# Patient Record
Sex: Male | Born: 1985 | Race: Black or African American | Hispanic: No | Marital: Married
Health system: Southern US, Community
[De-identification: ages and names within clinical notes are randomized; demographics above are authoritative.]

---

## 2006-04-06 ENCOUNTER — Emergency Department (HOSPITAL_COMMUNITY): Admission: EM | Admit: 2006-04-06 | Discharge: 2006-04-06 | Payer: Self-pay | Admitting: Emergency Medicine

## 2010-07-14 ENCOUNTER — Emergency Department (HOSPITAL_COMMUNITY)
Admission: EM | Admit: 2010-07-14 | Discharge: 2010-07-14 | Payer: Self-pay | Source: Home / Self Care | Admitting: Emergency Medicine

## 2014-07-24 ENCOUNTER — Ambulatory Visit
Admission: RE | Admit: 2014-07-24 | Discharge: 2014-07-24 | Disposition: A | Discharge status: Home / Self Care | Payer: BC Managed Care – PPO | Source: Ambulatory Visit | Attending: Internal Medicine | Admitting: Internal Medicine

## 2014-07-24 ENCOUNTER — Other Ambulatory Visit: Payer: Self-pay | Admitting: Internal Medicine

## 2014-07-24 DIAGNOSIS — J189 Pneumonia, unspecified organism: Secondary | ICD-10-CM

## 2016-03-15 IMAGING — CR DG CHEST 2V
2 series · 2 of 2 positions shown · non-contrast
Comparison: None.

CLINICAL DATA: Pneumonia.

EXAM:
CHEST  2 VIEW

[w chest pa]
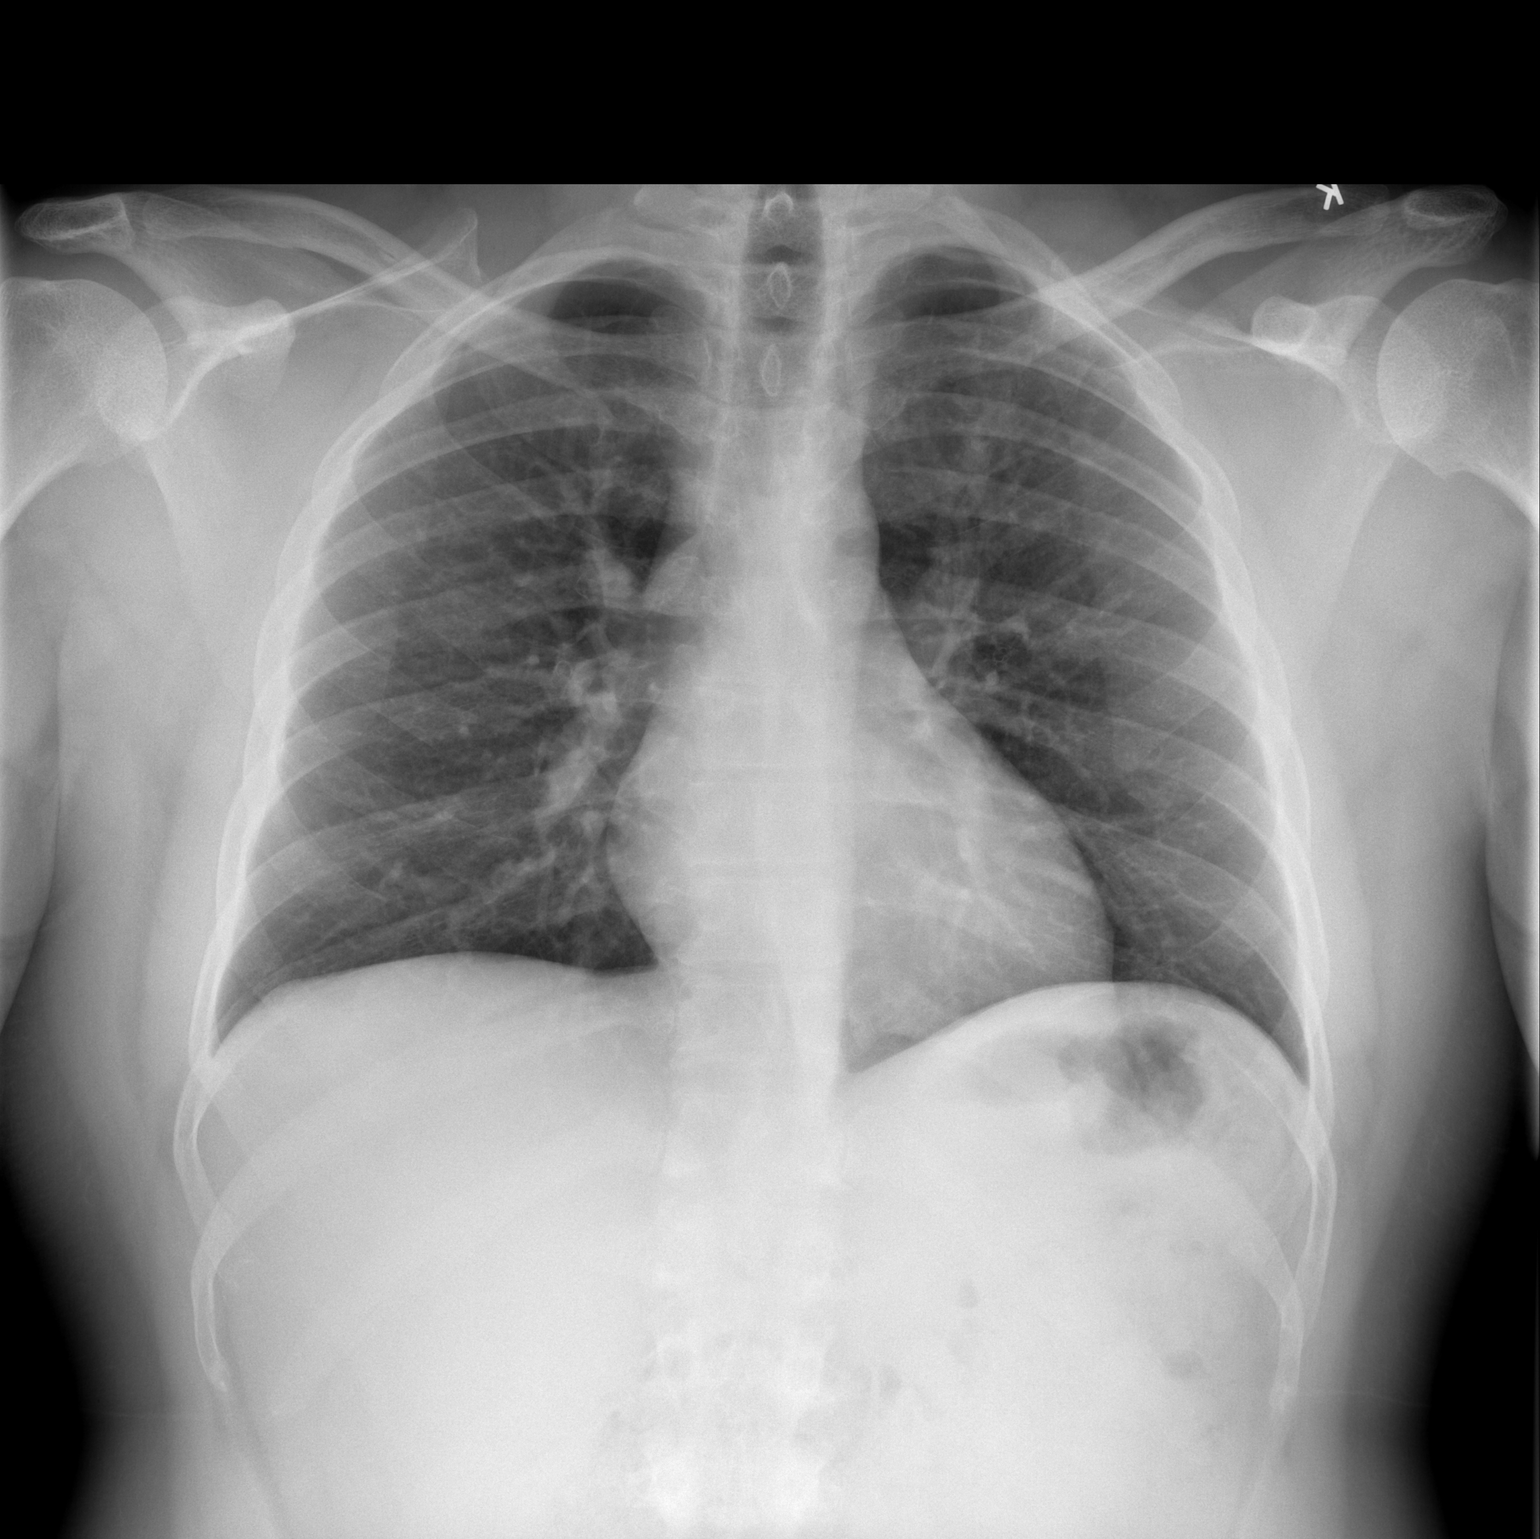

[w chest lat]
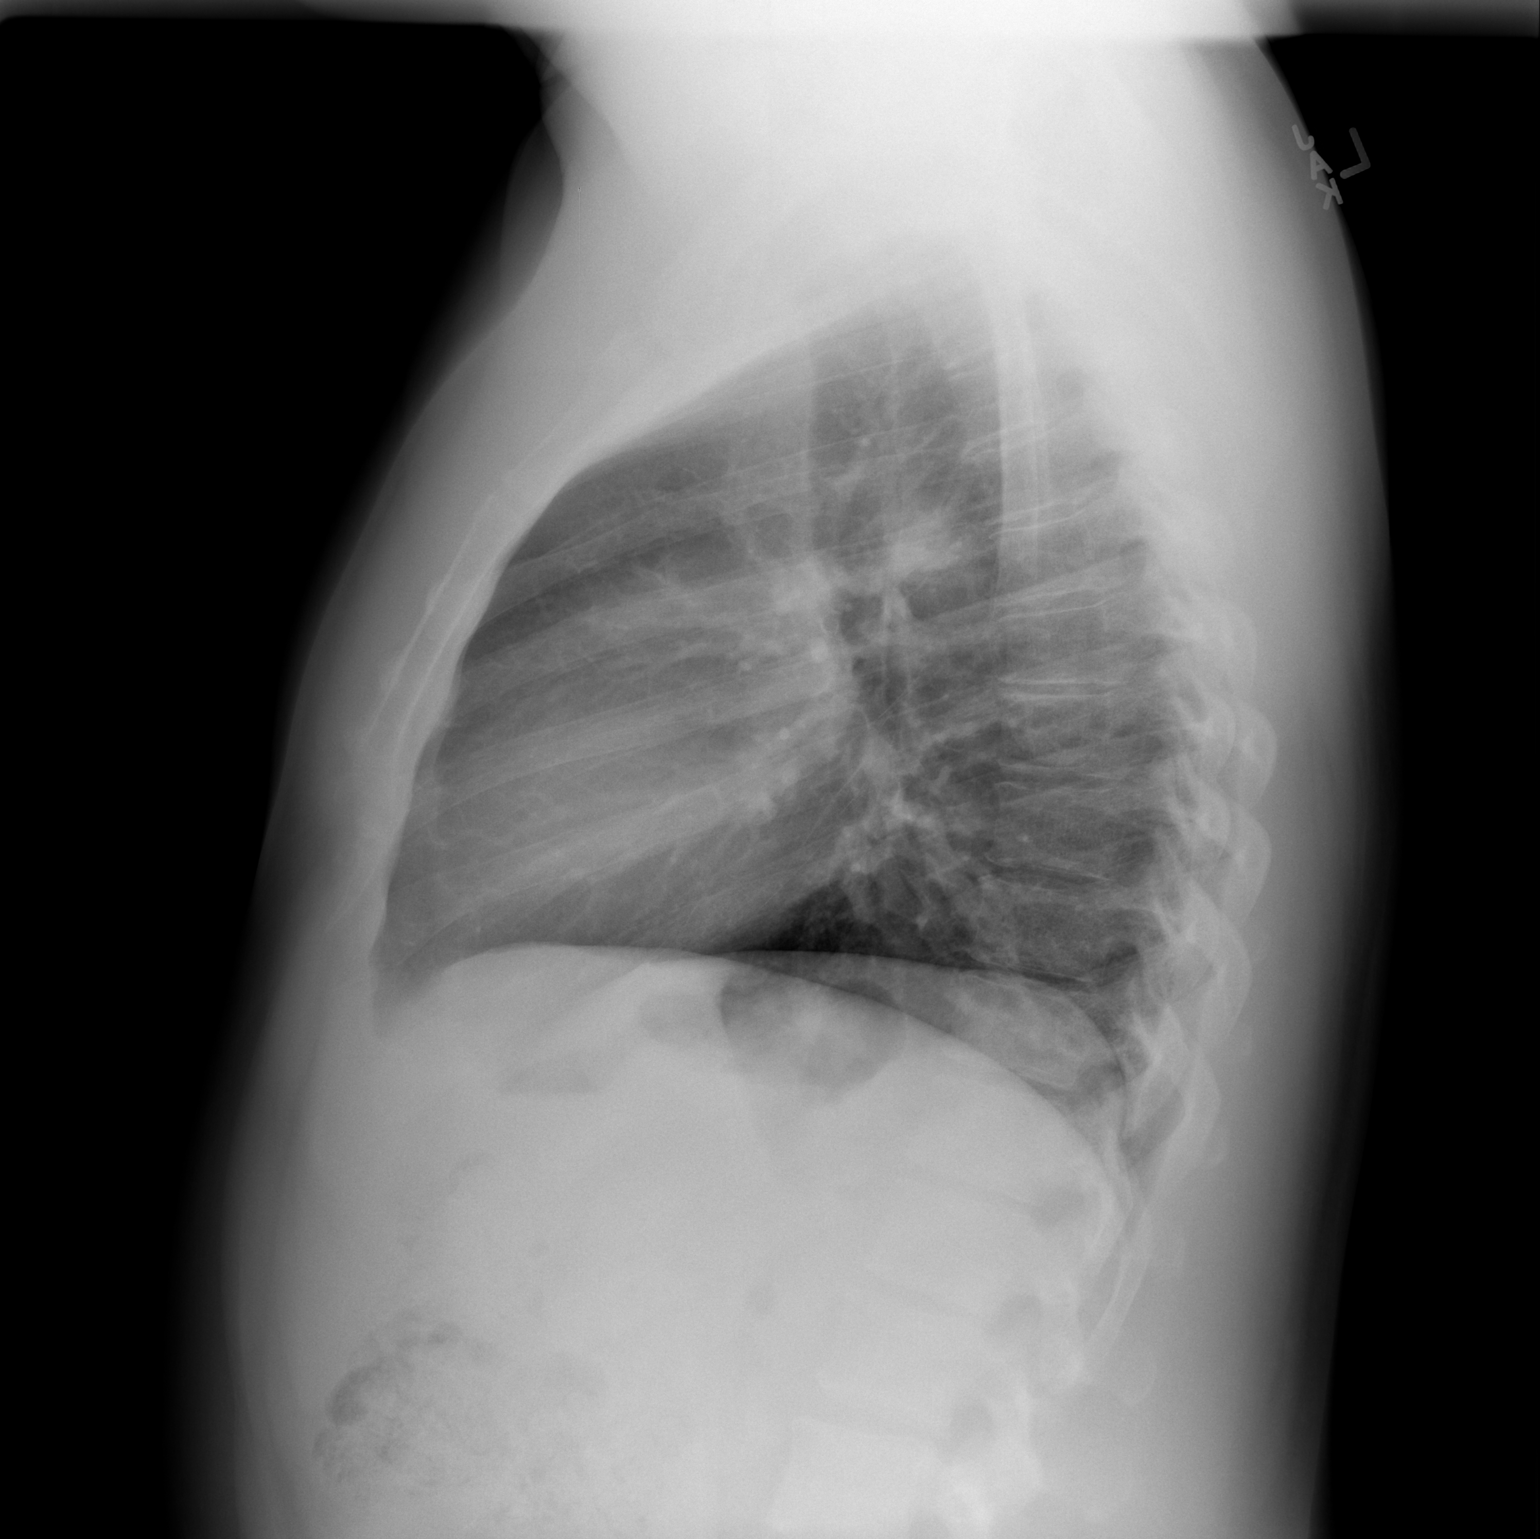

[2 of 2 positions shown; findings below may reference images not displayed]

FINDINGS: Mediastinum and hilar structures are normal. Lungs are clear. No
pleural effusion or pneumothorax. Heart size normal. Interim
clearing of right mid lung and left base pulmonary infiltrates. No
acute bony abnormality.
IMPRESSION: Interim clearing of bilateral pulmonary infiltrates.

## 2020-05-06 ENCOUNTER — Ambulatory Visit: Payer: BC Managed Care – PPO | Attending: Family

## 2020-05-06 DIAGNOSIS — Z23 Encounter for immunization: Secondary | ICD-10-CM

## 2020-06-05 ENCOUNTER — Ambulatory Visit: Payer: BC Managed Care – PPO | Attending: Family

## 2020-06-05 DIAGNOSIS — Z23 Encounter for immunization: Secondary | ICD-10-CM

## 2020-07-19 NOTE — Progress Notes (Signed)
   Covid-19 Vaccination Clinic  Name:  Wesley Nixon    MRN: [id number] DOB: 1986/02/18  07/19/2020  Wesley Nixon was observed post Covid-19 immunization for 15 minutes without incident. He was provided with Vaccine Information Sheet and instruction to access the V-Safe system.   Wesley Nixon was instructed to call 911 with any severe reactions post vaccine: Marland Kitchen Difficulty breathing  . Swelling of face and throat  . A fast heartbeat  . A bad rash all over body  . Dizziness and weakness   Immunizations Administered    Name Date Dose VIS Date Route   Pfizer COVID-19 Vaccine 05/06/2020  4:00 PM 0.3 mL 04/22/2020 Intramuscular   Manufacturer: [employer], [employer]   Lot: [id number]   NDC: [address]-5

## 2020-09-30 NOTE — Progress Notes (Signed)
   Covid-19 Vaccination Clinic  Name:  Wesley Nixon    MRN: [id number] DOB: April 03, 1986  09/30/2020  [name] was observed post Covid-19 immunization for 15 minutes without incident. He was provided with Vaccine Information Sheet and instruction to access the V-Safe system.   [name] was instructed to call 911 with any severe reactions post vaccine: Marland Kitchen Difficulty breathing  . Swelling of face and throat  . A fast heartbeat  . A bad rash all over body  . Dizziness and weakness   Immunizations Administered    Name Date Dose VIS Date Route   Pfizer COVID-19 Vaccine 06/05/2020  9:30 AM 0.3 mL 04/22/2020 Intramuscular   Manufacturer: [employer], [employer]   Lot: [id number]   NDC: [address]-2

## 2020-10-01 ENCOUNTER — Emergency Department (HOSPITAL_COMMUNITY): Payer: BC Managed Care – PPO

## 2020-10-01 ENCOUNTER — Other Ambulatory Visit: Payer: Self-pay

## 2020-10-01 ENCOUNTER — Ambulatory Visit (HOSPITAL_COMMUNITY)
Admission: EM | Admit: 2020-10-01 | Discharge: 2020-10-02 | Disposition: A | Discharge status: Home / Self Care | Payer: BC Managed Care – PPO | Attending: Emergency Medicine | Admitting: Emergency Medicine

## 2020-10-01 ENCOUNTER — Encounter (HOSPITAL_COMMUNITY): Payer: Self-pay | Admitting: Emergency Medicine

## 2020-10-01 ENCOUNTER — Encounter (HOSPITAL_COMMUNITY)
Admission: EM | Disposition: A | Discharge status: Home / Self Care | Payer: Self-pay | Source: Home / Self Care | Attending: Emergency Medicine

## 2020-10-01 DIAGNOSIS — Z23 Encounter for immunization: Secondary | ICD-10-CM | POA: Diagnosis not present

## 2020-10-01 DIAGNOSIS — Y9389 Activity, other specified: Secondary | ICD-10-CM | POA: Insufficient documentation

## 2020-10-01 DIAGNOSIS — Y9289 Other specified places as the place of occurrence of the external cause: Secondary | ICD-10-CM | POA: Insufficient documentation

## 2020-10-01 DIAGNOSIS — S62636B Displaced fracture of distal phalanx of right little finger, initial encounter for open fracture: Secondary | ICD-10-CM | POA: Diagnosis not present

## 2020-10-01 DIAGNOSIS — Z20822 Contact with and (suspected) exposure to covid-19: Secondary | ICD-10-CM | POA: Insufficient documentation

## 2020-10-01 DIAGNOSIS — X58XXXA Exposure to other specified factors, initial encounter: Secondary | ICD-10-CM | POA: Insufficient documentation

## 2020-10-01 DIAGNOSIS — Z91013 Allergy to seafood: Secondary | ICD-10-CM | POA: Insufficient documentation

## 2020-10-01 DIAGNOSIS — S61316A Laceration without foreign body of right little finger with damage to nail, initial encounter: Secondary | ICD-10-CM | POA: Diagnosis present

## 2020-10-01 HISTORY — PX: I & D EXTREMITY: SHX5045

## 2020-10-01 LAB — BASIC METABOLIC PANEL
Anion gap: 8 (ref 5–15)
BUN: 9 mg/dL (ref 6–20)
CO2: 26 mmol/L (ref 22–32)
Calcium: 9.6 mg/dL (ref 8.9–10.3)
Chloride: 101 mmol/L (ref 98–111)
Creatinine, Ser: 0.79 mg/dL (ref 0.61–1.24)
GFR, Estimated: >60 mL/min (ref >60)
Glucose, Bld: 279 mg/dL — ABNORMAL HIGH (ref 70–99)
Potassium: 4.1 mmol/L (ref 3.5–5.1)
Sodium: 135 mmol/L (ref 135–145)

## 2020-10-01 LAB — CBC WITH DIFFERENTIAL/PLATELET
Abs Immature Granulocytes: 0.02 10*3/uL (ref 0.00–0.07)
Basophils Absolute: 0 10*3/uL (ref 0.0–0.1)
Basophils Relative: 0 %
Eosinophils Absolute: 0 10*3/uL (ref 0.0–0.5)
Eosinophils Relative: 0 %
HCT: 49.7 % (ref 39.0–52.0)
Hemoglobin: 16.4 g/dL (ref 13.0–17.0)
Immature Granulocytes: 0 %
Lymphocytes Relative: 28 %
Lymphs Abs: 2.1 10*3/uL (ref 0.7–4.0)
MCH: 28.7 pg (ref 26.0–34.0)
MCHC: 33 g/dL (ref 30.0–36.0)
MCV: 87 fL (ref 80.0–100.0)
Monocytes Absolute: 0.5 10*3/uL (ref 0.1–1.0)
Monocytes Relative: 6 %
Neutro Abs: 5 10*3/uL (ref 1.7–7.7)
Neutrophils Relative %: 66 %
Platelets: 238 10*3/uL (ref 150–400)
RBC: 5.71 MIL/uL (ref 4.22–5.81)
RDW: 12.4 % (ref 11.5–15.5)
WBC: 7.6 10*3/uL (ref 4.0–10.5)
nRBC: 0 % (ref 0.0–0.2)

## 2020-10-01 LAB — RESP PANEL BY RT-PCR (FLU A&B, COVID) ARPGX2
Influenza A by PCR: NEGATIVE
Influenza B by PCR: NEGATIVE
SARS Coronavirus 2 by RT PCR: NEGATIVE

## 2020-10-01 LAB — CBG MONITORING, ED: Glucose-Capillary: 275 mg/dL — ABNORMAL HIGH (ref 70–99)

## 2020-10-01 SURGERY — IRRIGATION AND DEBRIDEMENT EXTREMITY
Anesthesia: General | Site: Finger | Laterality: Right

## 2020-10-01 MED ORDER — TETANUS-DIPHTH-ACELL PERTUSSIS 5-2.5-18.5 LF-MCG/0.5 IM SUSY
0.5000 mL | PREFILLED_SYRINGE | Freq: Once | INTRAMUSCULAR | Status: AC
  Administered 2020-10-01: 0.5 mL via INTRAMUSCULAR
  Filled 2020-10-01: qty 0.5

## 2020-10-01 MED ORDER — MIDAZOLAM HCL 2 MG/2ML IJ SOLN
INTRAMUSCULAR | Status: AC
  Filled 2020-10-01: qty 2

## 2020-10-01 MED ORDER — HYDROMORPHONE HCL 1 MG/ML IJ SOLN
1.0000 mg | Freq: Once | INTRAMUSCULAR | Status: AC
  Administered 2020-10-01: 1 mg via INTRAVENOUS
  Filled 2020-10-01: qty 1

## 2020-10-01 MED ORDER — FENTANYL CITRATE (PF) 250 MCG/5ML IJ SOLN
INTRAMUSCULAR | Status: AC
  Filled 2020-10-01: qty 5

## 2020-10-01 MED ORDER — CEFAZOLIN SODIUM-DEXTROSE 1-4 GM/50ML-% IV SOLN: 1.0000 g | Freq: Once | INTRAVENOUS | Status: DC

## 2020-10-01 MED ORDER — CEFAZOLIN SODIUM-DEXTROSE 2-4 GM/100ML-% IV SOLN
2.0000 g | Freq: Once | INTRAVENOUS | Status: AC
  Administered 2020-10-01: 2 g via INTRAVENOUS
  Filled 2020-10-01: qty 100

## 2020-10-01 SURGICAL SUPPLY — 61 items
ADAPTER CATH SYR TO TUBING 38M (ADAPTER) IMPLANT
BNDG COHESIVE 2X5 TAN STRL LF (GAUZE/BANDAGES/DRESSINGS) IMPLANT
BNDG ELASTIC 3X5.8 VLCR STR LF (GAUZE/BANDAGES/DRESSINGS) ×2 IMPLANT
BNDG ELASTIC 4X5.8 VLCR STR LF (GAUZE/BANDAGES/DRESSINGS) ×2 IMPLANT
BNDG ESMARK 4X9 LF (GAUZE/BANDAGES/DRESSINGS) ×2 IMPLANT
BNDG GAUZE ELAST 4 BULKY (GAUZE/BANDAGES/DRESSINGS) ×2 IMPLANT
CANNULA VESSEL 3MM 2 BLNT TIP (CANNULA) IMPLANT
CORD BIPOLAR FORCEPS 12FT (ELECTRODE) ×2 IMPLANT
COVER SURGICAL LIGHT HANDLE (MISCELLANEOUS) ×2 IMPLANT
COVER WAND RF STERILE (DRAPES) ×2 IMPLANT
CUFF TOURN SGL QUICK 18X4 (TOURNIQUET CUFF) ×2 IMPLANT
CUFF TOURN SGL QUICK 24 (TOURNIQUET CUFF)
CUFF TRNQT CYL 24X4X16.5-23 (TOURNIQUET CUFF) IMPLANT
DECANTER SPIKE VIAL GLASS SM (MISCELLANEOUS) ×2 IMPLANT
DRAIN PENROSE 1/4X12 LTX STRL (WOUND CARE) IMPLANT
DRSG PAD ABDOMINAL 8X10 ST (GAUZE/BANDAGES/DRESSINGS) IMPLANT
GAUZE SPONGE 4X4 12PLY STRL (GAUZE/BANDAGES/DRESSINGS) ×2 IMPLANT
GAUZE XEROFORM 1X8 LF (GAUZE/BANDAGES/DRESSINGS) ×2 IMPLANT
GLOVE BIO SURGEON STRL SZ7.5 (GLOVE) ×2 IMPLANT
GLOVE BIOGEL PI IND STRL 8.5 (GLOVE) IMPLANT
GLOVE BIOGEL PI INDICATOR 8.5 (GLOVE)
GLOVE BIOGEL PI ORTHO PRO SZ8 (GLOVE)
GLOVE PI ORTHO PRO STRL SZ8 (GLOVE) IMPLANT
GLOVE SRG 8 PF TXTR STRL LF DI (GLOVE) ×1 IMPLANT
GLOVE SURG ORTHO 8.0 STRL STRW (GLOVE) IMPLANT
GLOVE SURG UNDER POLY LF SZ8 (GLOVE) ×2
GOWN STRL REUS W/ TWL LRG LVL3 (GOWN DISPOSABLE) ×1 IMPLANT
GOWN STRL REUS W/ TWL XL LVL3 (GOWN DISPOSABLE) ×1 IMPLANT
GOWN STRL REUS W/TWL LRG LVL3 (GOWN DISPOSABLE) ×2
GOWN STRL REUS W/TWL XL LVL3 (GOWN DISPOSABLE) ×2
K-WIRE 4.0X.028 (WIRE) ×2 IMPLANT
KIT BASIN OR (CUSTOM PROCEDURE TRAY) ×2 IMPLANT
KIT TURNOVER KIT B (KITS) ×2 IMPLANT
LOOP VESSEL MAXI BLUE (MISCELLANEOUS) IMPLANT
MANIFOLD NEPTUNE II (INSTRUMENTS) IMPLANT
NEEDLE HYPO 25X1 1.5 SAFETY (NEEDLE) IMPLANT
NS IRRIG 1000ML POUR BTL (IV SOLUTION) ×2 IMPLANT
PACK ORTHO EXTREMITY (CUSTOM PROCEDURE TRAY) ×2 IMPLANT
PAD ARMBOARD 7.5X6 YLW CONV (MISCELLANEOUS) ×4 IMPLANT
PAD CAST 4YDX4 CTTN HI CHSV (CAST SUPPLIES) ×1 IMPLANT
PADDING CAST COTTON 4X4 STRL (CAST SUPPLIES) ×2
SET CYSTO W/LG BORE CLAMP LF (SET/KITS/TRAYS/PACK) ×2 IMPLANT
SLING ARM FOAM STRAP LRG (SOFTGOODS) ×2 IMPLANT
SOL PREP POV-IOD 4OZ 10% (MISCELLANEOUS) IMPLANT
SPLINT PLASTER EXTRA FAST 3X15 (CAST SUPPLIES) ×1
SPLINT PLASTER GYPS XFAST 3X15 (CAST SUPPLIES) ×1 IMPLANT
SPONGE LAP 4X18 RFD (DISPOSABLE) IMPLANT
SUT CHROMIC 6 0 PS 4 (SUTURE) ×2 IMPLANT
SUT ETHILON 4 0 P 3 18 (SUTURE) IMPLANT
SUT ETHILON 4 0 PS 2 18 (SUTURE) IMPLANT
SUT MON AB 5-0 P3 18 (SUTURE) IMPLANT
SUT MON AB 5-0 PS2 18 (SUTURE) ×2 IMPLANT
SWAB COLLECTION DEVICE MRSA (MISCELLANEOUS) IMPLANT
SWAB CULTURE ESWAB REG 1ML (MISCELLANEOUS) IMPLANT
SYR 20ML LL LF (SYRINGE) IMPLANT
SYR CONTROL 10ML LL (SYRINGE) ×2 IMPLANT
TOWEL GREEN STERILE (TOWEL DISPOSABLE) ×2 IMPLANT
TUBE CONNECTING 12X1/4 (SUCTIONS) ×2 IMPLANT
TUBE FEEDING ENTERAL 5FR 16IN (TUBING) IMPLANT
UNDERPAD 30X36 HEAVY ABSORB (UNDERPADS AND DIAPERS) ×4 IMPLANT
YANKAUER SUCT BULB TIP NO VENT (SUCTIONS) ×2 IMPLANT

## 2020-10-01 NOTE — Anesthesia Preprocedure Evaluation (Addendum)
Anesthesia Evaluation  Patient identified by MRN, date of birth, ID band Patient awake    Reviewed: Allergy & Precautions, NPO status , Patient's Chart, lab work & pertinent test results  Airway Mallampati: II  TM Distance: >3 FB Neck ROM: Full    Dental  (+) Dental Advisory Given, Teeth Intact   Pulmonary neg pulmonary ROS,    Pulmonary exam normal breath sounds clear to auscultation       Cardiovascular negative cardio ROS Normal cardiovascular exam Rhythm:Regular Rate:Normal     Neuro/Psych negative neurological ROS     GI/Hepatic negative GI ROS, Neg liver ROS,   Endo/Other  diabetes  Renal/GU negative Renal ROS     Musculoskeletal negative musculoskeletal ROS (+)   Abdominal   Peds  Hematology negative hematology ROS (+)   Anesthesia Other Findings   Reproductive/Obstetrics                            Anesthesia Physical Anesthesia Plan  ASA: II and emergent  Anesthesia Plan: General   Post-op Pain Management:    Induction: Intravenous, Rapid sequence and Cricoid pressure planned  PONV Risk Score and Plan: 3 and Ondansetron, Dexamethasone, Midazolam, Diphenhydramine and Treatment may vary due to age or medical condition  Airway Management Planned: Oral ETT  Additional Equipment: None  Intra-op Plan:   Post-operative Plan: Extubation in OR  Informed Consent: I have reviewed the patients History and Physical, chart, labs and discussed the procedure including the risks, benefits and alternatives for the proposed anesthesia with the patient or authorized representative who has indicated his/her understanding and acceptance.     Dental advisory given  Plan Discussed with: CRNA  Anesthesia Plan Comments:        Anesthesia Quick Evaluation

## 2020-10-01 NOTE — ED Notes (Signed)
Suture cart placed at bedside. 

## 2020-10-01 NOTE — ED Notes (Signed)
Right 4th& 5th finger appears to have an arterial bleed at tip of finger. Pressure dressing applied. MD notified.

## 2020-10-01 NOTE — ED Notes (Signed)
cbg = 275 

## 2020-10-01 NOTE — ED Provider Notes (Signed)
[facility] EMERGENCY DEPARTMENT Provider Note   CSN: [id number] Arrival date & time: 10/01/20  1957     History Chief Complaint  Patient presents with  . Hand Injury    Dail Chap is a 35 y.o. male.  The history is provided by the patient.  Hand Injury  Azarel Betzold is a 35 y.o. male who presents to the Emergency Department complaining of hand injury. He presents the emergency department for evaluation of injury to his right hand. He states a few hours prior to ED arrival he was cutting metal when his fourth and fifth digits were cut. He is right-hand dominant. Denies any additional injuries. He does have a history of diabetes. Tetanus is unknown. He has been vaccinated for COVID-19.    History reviewed. No pertinent past medical history.  There are no problems to display for this patient.   History reviewed. No pertinent surgical history.     No family history on file.     Home Medications Prior to Admission medications   Medication Sig Start Date End Date Taking? Authorizing Provider  acetaminophen (TYLENOL) 325 MG tablet Take 650 mg by mouth 2 (two) times daily as needed for moderate pain or headache.   Yes [provider]  Atorvastatin Calcium (LIPITOR PO) Take 1 tablet by mouth at bedtime. Patient not taking: Reported on 10/01/2020    [provider]  Insulin Glargine (BASAGLAR KWIKPEN) 100 UNIT/ML Inject 15 Units into the skin at bedtime. Patient not taking: Reported on 10/01/2020    [provider]    Allergies    Shellfish allergy  Review of Systems   Review of Systems  All other systems reviewed and are negative.   Physical Exam Updated Vital Signs BP (!) 162/101   Pulse 79   Temp 98.5 F (36.9 C) (Oral)   Resp (!) 22   SpO2 96%   Physical Exam Vitals and nursing note reviewed.  Constitutional:      Appearance: He is well-developed.  HENT:     Head: Normocephalic and atraumatic.   Cardiovascular:     Rate and Rhythm: Normal rate and regular rhythm.  Pulmonary:     Effort: Pulmonary effort is normal. No respiratory distress.  Musculoskeletal:     Comments: There is a laceration to the distal aspect of the fifth digit that involves the nail. There is a laceration to the palmar aspect of the fourth digit. Flexion extension is intact to the fourth and fifth digits. Distal sensation is intact to the digits. 2+ radial pulses bilaterally.  Skin:    General: Skin is warm and dry.  Neurological:     Mental Status: He is alert and oriented to person, place, and time.  Psychiatric:        Behavior: Behavior normal.         ED Results / Procedures / Treatments   Labs (all labs ordered are listed, but only abnormal results are displayed) Labs Reviewed  BASIC METABOLIC PANEL - Abnormal; Notable for the following components:      Result Value   Glucose, Bld 279 (*)    All other components within normal limits  CBG MONITORING, ED - Abnormal; Notable for the following components:   Glucose-Capillary 275 (*)    All other components within normal limits  RESP PANEL BY RT-PCR (FLU A&B, COVID) ARPGX2  CBC WITH DIFFERENTIAL/PLATELET    EKG None  Radiology DG Hand Complete Right  Result Date: 10/01/2020 CLINICAL DATA:  Right fourth and fifth digit pain, laceration EXAM: RIGHT HAND - COMPLETE 3+ VIEW COMPARISON:  None. FINDINGS: Frontal, oblique, lateral views of the right hand are obtained. There is a large laceration distal aspect fifth digit with open comminuted fracture of the fifth distal phalanx. No radiopaque foreign bodies. No other acute bony abnormalities. IMPRESSION: 1. Open comminuted fracture of the fifth distal phalanx, with large laceration distal aspect fifth digit. Electronically Signed   By: Sharlet Salina M.D.   On: 10/01/2020 20:39   DG MINI C-ARM IMAGE ONLY  Result Date: 10/01/2020 There is no interpretation for this exam.  This order is for images  obtained during a surgical procedure.  Please See "Surgeries" Tab for more information regarding the procedure.    Procedures Procedures   Medications Ordered in ED Medications  insulin aspart (novoLOG) 100 UNIT/ML injection (has no administration in time range)  HYDROmorphone (DILAUDID) injection 1 mg (1 mg Intravenous Given 10/01/20 2156)  ceFAZolin (ANCEF) IVPB 2g/100 mL premix (0 g Intravenous Stopped 10/01/20 2324)  Tdap (BOOSTRIX) injection 0.5 mL (0.5 mLs Intramuscular Given 10/01/20 2158)    ED Course  I have reviewed the triage vital signs and the nursing notes.  Pertinent labs & imaging results that were available during my care of the patient were reviewed by me and considered in my medical decision making (see chart for details).    MDM Rules/Calculators/A&P                         patient with history of diabetes here for evaluation of of hand injury. He has a complex laceration to the fourth and fifth digit. There is an open fracture. Discussed with a hand surgeon on call. Plan to admit for further repair. Patient is in agreement with treatment plan.  Final Clinical Impression(s) / ED Diagnoses Final diagnoses:  Laceration of right little finger without foreign body with damage to nail, initial encounter    Rx / DC Orders ED Discharge Orders    None       [name], MD 10/02/20 [phone]

## 2020-10-01 NOTE — ED Triage Notes (Signed)
Pt presents with laceration splitting the tip of the right 5th finger. States he was working with metal, unsure what he cut hand on, but likely a saw. Bleeding controlled at this time.

## 2020-10-02 ENCOUNTER — Encounter (HOSPITAL_COMMUNITY): Payer: Self-pay | Admitting: Orthopedic Surgery

## 2020-10-02 ENCOUNTER — Emergency Department (HOSPITAL_COMMUNITY): Payer: BC Managed Care – PPO | Admitting: Anesthesiology

## 2020-10-02 LAB — GLUCOSE, CAPILLARY
Glucose-Capillary: 242 mg/dL — ABNORMAL HIGH (ref 70–99)
Glucose-Capillary: 287 mg/dL — ABNORMAL HIGH (ref 70–99)
Glucose-Capillary: 261 mg/dL — ABNORMAL HIGH (ref 70–99)
Glucose-Capillary: 294 mg/dL — ABNORMAL HIGH (ref 70–99)

## 2020-10-02 MED ORDER — PROPOFOL 10 MG/ML IV BOLUS
INTRAVENOUS | Status: DC | PRN
  Administered 2020-10-02: 200 mg via INTRAVENOUS

## 2020-10-02 MED ORDER — PROMETHAZINE HCL 25 MG/ML IJ SOLN: 6.2500 mg | INTRAMUSCULAR | Status: DC | PRN

## 2020-10-02 MED ORDER — 0.9 % SODIUM CHLORIDE (POUR BTL) OPTIME
TOPICAL | Status: DC | PRN
  Administered 2020-10-02: 1000 mL

## 2020-10-02 MED ORDER — INSULIN ASPART 100 UNIT/ML ~~LOC~~ SOLN
SUBCUTANEOUS | Status: AC
  Filled 2020-10-02: qty 1

## 2020-10-02 MED ORDER — DEXAMETHASONE SODIUM PHOSPHATE 4 MG/ML IJ SOLN
INTRAMUSCULAR | Status: DC | PRN
  Administered 2020-10-02: 5 mg via INTRAVENOUS

## 2020-10-02 MED ORDER — DIPHENHYDRAMINE HCL 50 MG/ML IJ SOLN
INTRAMUSCULAR | Status: DC | PRN
  Administered 2020-10-02: 25 mg via INTRAVENOUS

## 2020-10-02 MED ORDER — SUGAMMADEX SODIUM 200 MG/2ML IV SOLN
INTRAVENOUS | Status: DC | PRN
  Administered 2020-10-02: 200 mg via INTRAVENOUS

## 2020-10-02 MED ORDER — BUPIVACAINE HCL (PF) 0.25 % IJ SOLN
INTRAMUSCULAR | Status: DC | PRN
  Administered 2020-10-02: 10 mL

## 2020-10-02 MED ORDER — SODIUM CHLORIDE 0.9 % IR SOLN
Status: DC | PRN
  Administered 2020-10-02: 3000 mL

## 2020-10-02 MED ORDER — OXYCODONE HCL 5 MG/5ML PO SOLN: 5.0000 mg | Freq: Once | ORAL | Status: DC | PRN

## 2020-10-02 MED ORDER — ROCURONIUM BROMIDE 100 MG/10ML IV SOLN
INTRAVENOUS | Status: DC | PRN
  Administered 2020-10-02: 60 mg via INTRAVENOUS

## 2020-10-02 MED ORDER — LIDOCAINE HCL (CARDIAC) PF 100 MG/5ML IV SOSY
PREFILLED_SYRINGE | INTRAVENOUS | Status: DC | PRN
  Administered 2020-10-02: 100 mg via INTRAVENOUS

## 2020-10-02 MED ORDER — INSULIN ASPART 100 UNIT/ML ~~LOC~~ SOLN
5.0000 [IU] | Freq: Once | SUBCUTANEOUS | Status: AC
  Administered 2020-10-02: 5 [IU] via SUBCUTANEOUS

## 2020-10-02 MED ORDER — HYDROCODONE-ACETAMINOPHEN 5-325 MG PO TABS: ORAL_TABLET | ORAL | 0 refills | Status: AC

## 2020-10-02 MED ORDER — HYDROMORPHONE HCL 1 MG/ML IJ SOLN: 0.2500 mg | INTRAMUSCULAR | Status: DC | PRN

## 2020-10-02 MED ORDER — OXYCODONE HCL 5 MG PO TABS: 5.0000 mg | ORAL_TABLET | Freq: Once | ORAL | Status: DC | PRN

## 2020-10-02 MED ORDER — ONDANSETRON HCL 4 MG/2ML IJ SOLN
INTRAMUSCULAR | Status: DC | PRN
  Administered 2020-10-02: 4 mg via INTRAVENOUS

## 2020-10-02 MED ORDER — LACTATED RINGERS IV SOLN: INTRAVENOUS | Status: DC | PRN

## 2020-10-02 MED ORDER — INSULIN ASPART 100 UNIT/ML ~~LOC~~ SOLN
SUBCUTANEOUS | Status: DC | PRN
  Administered 2020-10-02: 8 [IU] via SUBCUTANEOUS

## 2020-10-02 MED ORDER — MIDAZOLAM HCL 2 MG/2ML IJ SOLN
INTRAMUSCULAR | Status: DC | PRN
  Administered 2020-10-02: 2 mg via INTRAVENOUS

## 2020-10-02 MED ORDER — BUPIVACAINE HCL (PF) 0.25 % IJ SOLN
INTRAMUSCULAR | Status: AC
  Filled 2020-10-02: qty 30

## 2020-10-02 MED ORDER — FENTANYL CITRATE (PF) 100 MCG/2ML IJ SOLN
INTRAMUSCULAR | Status: DC | PRN
  Administered 2020-10-02: 50 ug via INTRAVENOUS
  Administered 2020-10-02: 100 ug via INTRAVENOUS
  Administered 2020-10-02: 50 ug via INTRAVENOUS

## 2020-10-02 MED ORDER — MEPERIDINE HCL 25 MG/ML IJ SOLN: 6.2500 mg | INTRAMUSCULAR | Status: DC | PRN

## 2020-10-02 MED ORDER — SULFAMETHOXAZOLE-TRIMETHOPRIM 800-160 MG PO TABS: 1.0000 | ORAL_TABLET | Freq: Two times a day (BID) | ORAL | 0 refills | Status: AC

## 2020-10-02 NOTE — Anesthesia Postprocedure Evaluation (Signed)
Anesthesia Post Note  Patient: Wesley Nixon  Procedure(s) Performed: Irrigation and Debridement of Small finger, reduction of fracture and repair nail bed.  Exploration of wound of Ring finger. (Right Finger)     Patient location during evaluation: PACU Anesthesia Type: General Level of consciousness: sedated and patient cooperative Pain management: pain level controlled Vital Signs Assessment: post-procedure vital signs reviewed and stable Respiratory status: spontaneous breathing Cardiovascular status: stable Anesthetic complications: no   No complications documented.  Last Vitals:  Vitals:   10/02/20 0315 10/02/20 0330  BP: (!) 159/100 (!) 144/88  Pulse: 71 80  Resp: 15 (!) 26  Temp:    SpO2: 98% 100%    Last Pain:  Vitals:   10/02/20 0300  TempSrc:   PainSc: Asleep                 [name]

## 2020-10-02 NOTE — Anesthesia Procedure Notes (Signed)
Procedure Name: Intubation Performed by: Adair Laundry, CRNA Pre-anesthesia Checklist: Patient identified, Suction available, Patient being monitored and Emergency Drugs available Patient Re-evaluated:Patient Re-evaluated prior to induction Oxygen Delivery Method: Circle system utilized Preoxygenation: Pre-oxygenation with 100% oxygen Induction Type: IV induction Ventilation: Mask ventilation without difficulty Laryngoscope Size: Miller Grade View: Grade I Tube type: Oral Tube size: 7.5 mm Number of attempts: 1 Airway Equipment and Method: Stylet Placement Confirmation: ETT inserted through vocal cords under direct vision Secured at: 23 cm Tube secured with: Tape Dental Injury: Teeth and Oropharynx as per pre-operative assessment

## 2020-10-02 NOTE — Op Note (Addendum)
NAME: Wesley Nixon MEDICAL RECORD NO: [id number] DATE OF BIRTH: 1985-10-19 FACILITY: [facility] LOCATION: [facility] OR PHYSICIAN: Tami Ribas, MD   OPERATIVE REPORT   DATE OF PROCEDURE: 10/02/20    PREOPERATIVE DIAGNOSIS:   Right small finger open distal phalanx fracture with skin and nailbed lacerations, right ring finger laceration with possible tendon artery nerve injury   POSTOPERATIVE DIAGNOSIS:   1.  Right small finger open intra-articular distal phalanx fracture 2.  Right small finger skin and nailbed lacerations 3.  Right ring finger laceration   PROCEDURE:   1.  Irrigation and debridement of right small finger open intra-articular distal phalanx fracture 2.  Open reduction pin fixation right small finger open intra-articular distal phalanx fracture 3.  Right small finger repair of skin and nailbed lacerations 4.  Right ring finger closure of intermediate wound, approximately 3 cm   SURGEON:  Betha Loa, M.D.   ASSISTANT: none   ANESTHESIA:  General   INTRAVENOUS FLUIDS:  Per anesthesia flow sheet.   ESTIMATED BLOOD LOSS:  Minimal.   COMPLICATIONS:  None.   SPECIMENS:  none   TOURNIQUET TIME:    Total Tourniquet Time Documented: Upper Arm (Right) - 83 minutes Total: Upper Arm (Right) - 83 minutes    DISPOSITION:  Stable to PACU.  Debridement type: Excisional Debridement  Side: right  Body Location: Right ring and small fingers  Tools used for debridement: scalpel, scissors, pickups  Pre-debridement Wound size (cm):    Ring finger: Length: 3 cm        Depth: 0.25 cm Small finger: Length: 4.5 cm       Depth: 1 cm  Post-debridement Wound size (cm):   Wounds closed  Debridement depth beyond dead/damaged tissue down to healthy viable tissue: yes  Tissue layer involved: skin, subcutaneous tissue, muscle / fascia  Nature of tissue removed: Devitalized Tissue  Irrigation volume: 3000 cc    Irrigation fluid type: Normal Saline    INDICATIONS:  35 year old male states he sustained lacerations to the right ring and small fingers while using a miter saw to cut metal in the evening of October 01, 2020.  He was brought to [facility] emergency department radiographs were taken revealing a fracture of the distal phalanx of the small finger.  I recommended operative irrigation and debridement with exploration of wound and repair of tendon artery nerve is necessary with open reduction and fixation of the fracture. Risks, benefits and alternatives of surgery were discussed including the risks of blood loss, infection, damage to nerves, vessels, tendons, ligaments, bone for surgery, need for additional surgery, complications with wound healing, continued pain, nonunion, malunion,  stiffness.  He voiced understanding of these risks and elected to proceed.  OPERATIVE COURSE:  After being identified preoperatively by myself,  the patient and I agreed on the procedure and site of the procedure.  The surgical site was marked.  Surgical consent had been signed. He was given IV antibiotics as preoperative antibiotic prophylaxis. He was transferred to the operating room and placed on the operating table in supine position with the Right upper extremity on an arm board.  General anesthesia was induced by the anesthesiologist.  Right upper extremity was prepped and draped in normal sterile orthopedic fashion.  A surgical pause was performed between the surgeons, anesthesia, and operating room staff and all were in agreement as to the patient, procedure, and site of procedure.  The ring finger wound was explored.  There is a small pulsatile bleeding vessel  near the midportion of the wound.  Tourniquet at the proximal aspect of the extremity was inflated to 250 mmHg after exsanguination of the arm with an Esmarch bandage.    The wounds were explored.  In the ring finger there was no gross contamination.  The laceration went down to the tendon sheath and just into the tendon  sheath.  The tendons were intact.  There is a small nick in the tendons that did not require repair.  The ulnar nerve and artery were identified and were intact.  The radial neurovascular bundle was identified and was intact.  The small finger wound was then explored.  This was a longitudinal wound from the radial side of the tip of the finger obliquely down to the DIP joint.  The germinal matrix was split.  The dorsal nail roof was split.  The FDP tendon was identified and was intact with the insertion on the bone.  There was gross contamination with a blue plastic type substance and 1 piece of metal shaving.  These were removed with the pickups.  The wound was debrided of hematoma.  The scissors and knife were used to debride the skin subcutaneous tissues and fascia.  Any devitalized tissue was removed.  A small piece of bone was removed with the pickups.  The wounds were copiously irrigated with sterile saline by cystoscopy tubing.  The tourniquet was deflated at approximately 17 minutes to examine the ring finger wound.  Bipolar electrocautery was used to obtain hemostasis.  There was no bleeding from the radial or ulnar digital artery.  The tourniquet was reinflated after exsanguination of the limb with an Esmarch bandage.  The fracture of the distal phalanx of the small finger was reduced under direct visualization.  A 0.028 inch K wire was advanced from the tip of the finger across the bone fragment on the radial side which was larger and across the DIP joint.  This is adequate stabilize this fragment.  The ulnar-sided fragment was too small for any type of fixation.  The wounds were again copiously irrigated with sterile saline by bulb syringe.  The pin was bent and cut short.  The wound of the ring finger was closed with 5-0 Monocryl in a horizontal mattress fashion.  The knife was used to remove devitalized skin edges.  The skin in the small finger was reapproximated using a 5-0 Monocryl suture in an  interrupted fashion.  The nailbed was reapproximated using 6-0 chromic suture in an interrupted fashion.  Good reapproximation of the soft tissues was obtained.  The dorsal skin wound was closed with the 5-0 Monocryl in an interrupted fashion.  Digital blocks were performed with quarter percent plain Marcaine to aid in postoperative analgesia.  A piece of Xeroform was placed in the nail fold and the wounds were dressed with sterile Xeroform 4 x 4's and wrapped with a Kerlix bandage.  Volar splint was placed including long ring and small fingers.  This was wrapped with Kerlix and Ace bandage.  The tourniquet was deflated at 83 minutes.  Fingertips were pink with brisk capillary refill after deflation of tourniquet.  The operative  drapes were broken down.  The patient was awoken from anesthesia safely.  He was transferred back to the stretcher and taken to PACU in stable condition.  I will see him back in the office in 1 week for postoperative followup.  I will give him a prescription for Norco 5/325 1-2 tabs PO q6 hours prn pain, dispense #  30 and Bactrim DS 1 p.o. twice daily x7 days.   Betha Loa, MD Electronically signed, 10/02/20

## 2020-10-02 NOTE — H&P (Signed)
Wesley Nixon is an 35 y.o. male.   Chief Complaint: finger lacerations HPI: 35 yo rhd male states he injured right ring and small fingers while cutting sheet metal evening of 10/01/20.  Unsure if metal or blade caused injury.  Seen at [facility] where XR revealed fracture right small finger distal phalanx. He reports no previous injury to right hand and no other injury at this time.    Case discussed with [name], MD and her note from 10/02/2020 reviewed. Xrays viewed and interpreted by me: 3 views right hand show longitudinal fracture right small finger distal phalanx.   Labs reviewed: none  Allergies:  Allergies  Allergen Reactions  . Shellfish Allergy     History reviewed. No pertinent past medical history.  History reviewed. No pertinent surgical history.  Family History: No family history on file.  Social History:   has no history on file for tobacco use, alcohol use, and drug use.  Medications: (Not in a hospital admission)   Results for orders placed or performed during the hospital encounter of 10/01/20 (from the past 48 hour(s))  Resp Panel by RT-PCR (Flu A&B, Covid) Nasopharyngeal Swab     Status: None   Collection Time: 10/01/20  9:49 PM   Specimen: Nasopharyngeal Swab; Nasopharyngeal(NP) swabs in vial transport medium  Result Value Ref Range   SARS Coronavirus 2 by RT PCR NEGATIVE NEGATIVE    Comment: (NOTE) SARS-CoV-2 target nucleic acids are NOT DETECTED.  The SARS-CoV-2 RNA is generally detectable in upper respiratory specimens during the acute phase of infection. The lowest concentration of SARS-CoV-2 viral copies this assay can detect is 138 copies/mL. A negative result does not preclude SARS-Cov-2 infection and should not be used as the sole basis for treatment or other patient management decisions. A negative result may occur with  improper specimen collection/handling, submission of specimen other than nasopharyngeal swab, presence of viral  mutation(s) within the areas targeted by this assay, and inadequate number of viral copies(<138 copies/mL). A negative result must be combined with clinical observations, patient history, and epidemiological information. The expected result is Negative.  Fact Sheet for Patients:  BloggerCourse.com  Fact Sheet for Healthcare Providers:  SeriousBroker.it  This test is no t yet approved or cleared by the [place] FDA and  has been authorized for detection and/or diagnosis of SARS-CoV-2 by FDA under an Emergency Use Authorization (EUA). This EUA will remain  in effect (meaning this test can be used) for the duration of the COVID-19 declaration under Section 564(b)(1) of the Act, 21 U.S.C.section 360bbb-3(b)(1), unless the authorization is terminated  or revoked sooner.       Influenza A by PCR NEGATIVE NEGATIVE   Influenza B by PCR NEGATIVE NEGATIVE    Comment: (NOTE) The Xpert Xpress SARS-CoV-2/FLU/RSV plus assay is intended as an aid in the diagnosis of influenza from Nasopharyngeal swab specimens and should not be used as a sole basis for treatment. Nasal washings and aspirates are unacceptable for Xpert Xpress SARS-CoV-2/FLU/RSV testing.  Fact Sheet for Patients: BloggerCourse.com  Fact Sheet for Healthcare Providers: SeriousBroker.it  This test is not yet approved or cleared by the [place] FDA and has been authorized for detection and/or diagnosis of SARS-CoV-2 by FDA under an Emergency Use Authorization (EUA). This EUA will remain in effect (meaning this test can be used) for the duration of the COVID-19 declaration under Section 564(b)(1) of the Act, 21 U.S.C. section 360bbb-3(b)(1), unless the authorization is terminated or revoked.  Performed at [facility], [address], [place], [place] [address]   CBC with Differential     Status: None    Collection Time: 10/01/20  9:49 PM  Result Value Ref Range   WBC 7.6 4.0 - 10.5 K/uL   RBC 5.71 4.22 - 5.81 MIL/uL   Hemoglobin 16.4 13.0 - 17.0 g/dL   HCT 44.0 34.7 - 42.5 %   MCV 87.0 80.0 - 100.0 fL   MCH 28.7 26.0 - 34.0 pg   MCHC 33.0 30.0 - 36.0 g/dL   RDW 95.6 38.7 - 56.4 %   Platelets 238 150 - 400 K/uL   nRBC 0.0 0.0 - 0.2 %   Neutrophils Relative % 66 %   Neutro Abs 5.0 1.7 - 7.7 K/uL   Lymphocytes Relative 28 %   Lymphs Abs 2.1 0.7 - 4.0 K/uL   Monocytes Relative 6 %   Monocytes Absolute 0.5 0.1 - 1.0 K/uL   Eosinophils Relative 0 %   Eosinophils Absolute 0.0 0.0 - 0.5 K/uL   Basophils Relative 0 %   Basophils Absolute 0.0 0.0 - 0.1 K/uL   Immature Granulocytes 0 %   Abs Immature Granulocytes 0.02 0.00 - 0.07 K/uL    Comment: Performed at [facility], [address], [place], [place] [address]  Basic metabolic panel     Status: Abnormal   Collection Time: 10/01/20  9:49 PM  Result Value Ref Range   Sodium 135 135 - 145 mmol/L   Potassium 4.1 3.5 - 5.1 mmol/L   Chloride 101 98 - 111 mmol/L   CO2 26 22 - 32 mmol/L   Glucose, Bld 279 (H) 70 - 99 mg/dL    Comment: Glucose reference range applies only to samples taken after fasting for at least 8 hours.   BUN 9 6 - 20 mg/dL   Creatinine, Ser 1.88 0.61 - 1.24 mg/dL   Calcium 9.6 8.9 - 41.6 mg/dL   GFR, Estimated >60 >63 mL/min    Comment: (NOTE) Calculated using the CKD-EPI Creatinine Equation (2021)    Anion gap 8 5 - 15    Comment: Performed at [facility], [address] [place], [place] [address]  CBG monitoring, ED     Status: Abnormal   Collection Time: 10/01/20 10:08 PM  Result Value Ref Range   Glucose-Capillary 275 (H) 70 - 99 mg/dL    Comment: Glucose reference range applies only to samples taken after fasting for at least 8 hours.   Comment 1 Notify RN    Comment 2 Document in Chart     DG Hand Complete Right  Result Date: 10/01/2020 CLINICAL DATA:  Right fourth and fifth digit  pain, laceration EXAM: RIGHT HAND - COMPLETE 3+ VIEW COMPARISON:  None. FINDINGS: Frontal, oblique, lateral views of the right hand are obtained. There is a large laceration distal aspect fifth digit with open comminuted fracture of the fifth distal phalanx. No radiopaque foreign bodies. No other acute bony abnormalities. IMPRESSION: 1. Open comminuted fracture of the fifth distal phalanx, with large laceration distal aspect fifth digit. Electronically Signed   By: Sharlet Salina M.D.   On: 10/01/2020 20:39   DG MINI C-ARM IMAGE ONLY  Result Date: 10/01/2020 There is no interpretation for this exam.  This order is for images obtained during a surgical procedure.  Please See "Surgeries" Tab for more information regarding the procedure.     A comprehensive review of systems was negative. Review of Systems: No fevers, chills,  night sweats, chest pain, shortness of breath, nausea, vomiting, diarrhea, constipation, easy bleeding or bruising, headaches, dizziness, vision changes, fainting.   Blood pressure (!) 162/101, pulse 79, temperature 98.5 F (36.9 C), temperature source Oral, resp. rate (!) 22, SpO2 96 %.  General appearance: alert, cooperative and appears stated age Head: Normocephalic, without obvious abnormality, atraumatic Neck: supple, symmetrical, trachea midline Resp: clear to auscultation bilaterally Cardio: regular rate and rhythm Extremities: Intact sensation and capillary refill all digits.  +epl/fpl/io.  Laceration palmar side middle phalanx ring finger.  Intact sensation and capillary refill radial and ulnar sides of pad of finger.  Able to flex dip joint.  Small finger with longitudinal laceration involving nail bed. Pulses: 2+ and symmetric Skin: Skin color, texture, turgor normal. No rashes or lesions Neurologic: Grossly normal Incision/Wound: as above  Assessment/Plan Right ring and small finger lacerations.  Recommend OR for exploration of wounds with repair  tendon/artery/nerve as necessary, reduction and pinning of fracture, repair skin and nail bed.  Risks, benefits and alternatives of surgery were discussed including risks of blood loss, infection, damage to nerves/vessels/tendons/ligament/bone, failure of surgery, need for additional surgery, complication with wound healing, stiffness.  He voiced understanding of these risks and elected to proceed.    Betha Loa 10/02/2020, 12:08 AM

## 2020-10-02 NOTE — Discharge Instructions (Signed)

## 2020-10-02 NOTE — Transfer of Care (Signed)
Immediate Anesthesia Transfer of Care Note  Patient: Wesley Nixon  Procedure(s) Performed: Irrigation and Debridement of Small finger, reduction of fracture and repair nail bed.  Exploration of wound of Ring finger. (Right Finger)  Patient Location: PACU  Anesthesia Type:General  Level of Consciousness: drowsy  Airway & Oxygen Therapy: Patient Spontanous Breathing and Patient connected to nasal cannula oxygen  Post-op Assessment: Report given to RN and Post -op Vital signs reviewed and stable  Post vital signs: Reviewed and stable  Last Vitals:  Vitals Value Taken Time  BP    Temp    Pulse    Resp    SpO2      Last Pain:  Vitals:   10/01/20 2338  TempSrc: Oral  PainSc:          Complications: No complications documented.

## 2022-05-24 IMAGING — DX DG HAND COMPLETE 3+V*R*
3 series · 3 of 3 positions shown · non-contrast
Comparison: None.

CLINICAL DATA: Right fourth and fifth digit pain, laceration

EXAM:
RIGHT HAND - COMPLETE 3+ VIEW

[hand pa]
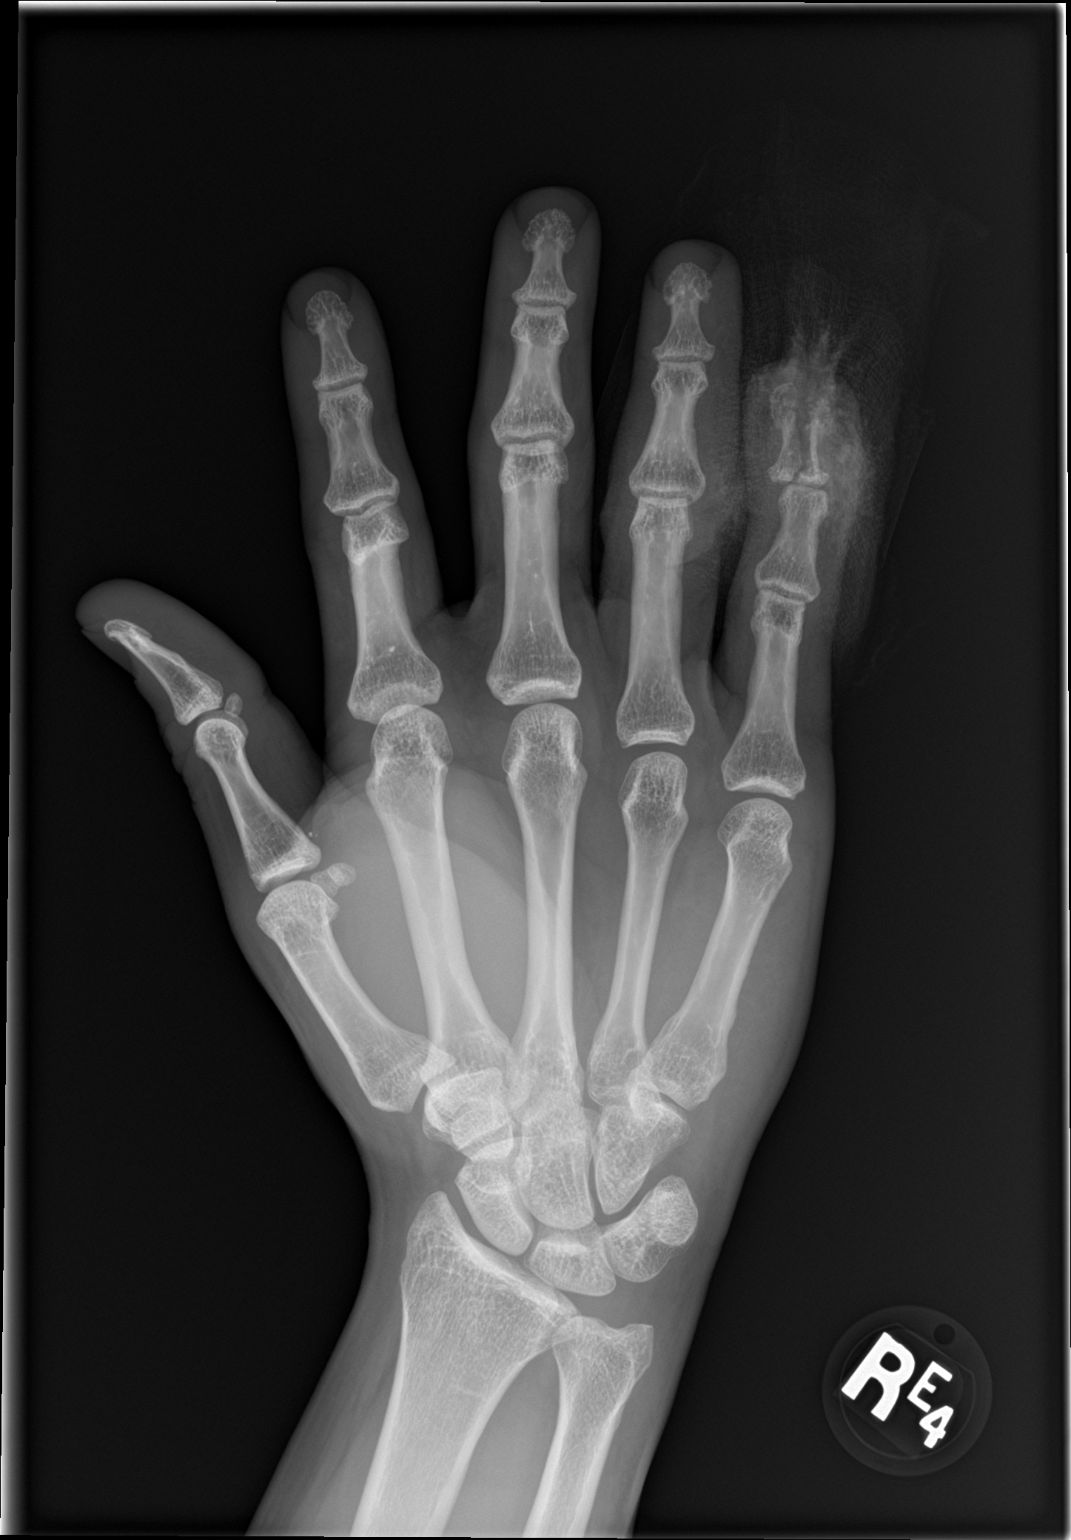

[hand obl]
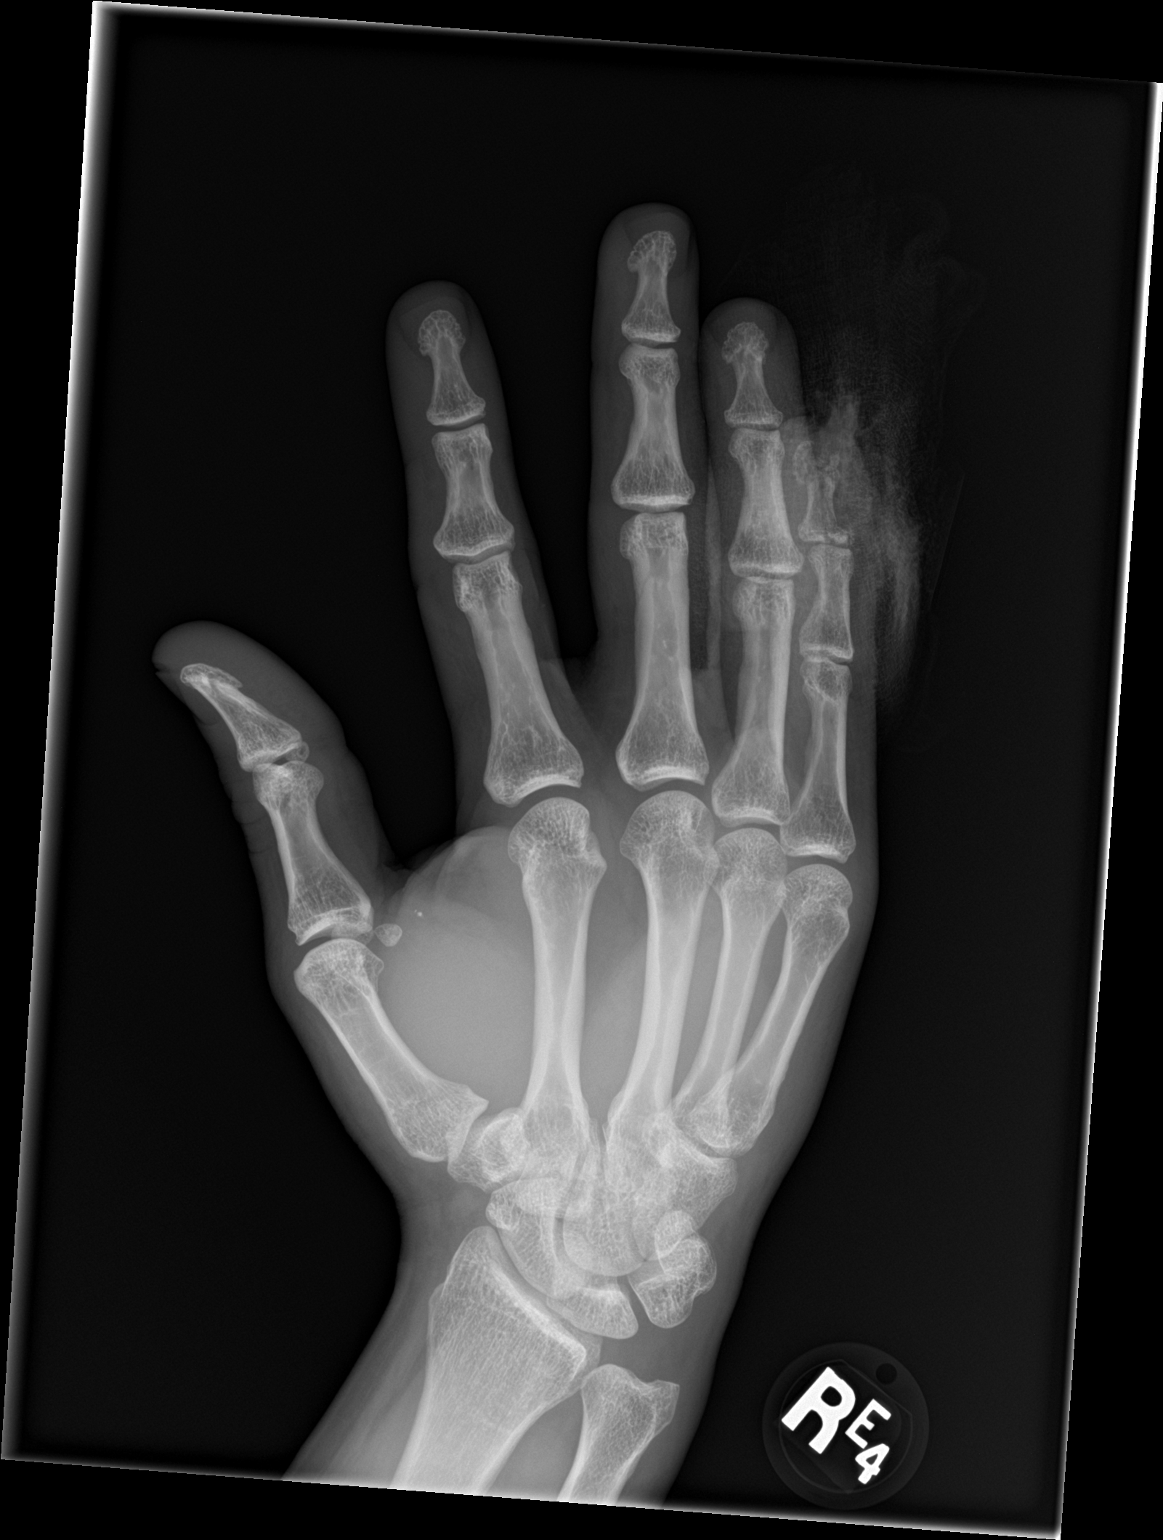

[hand lat]
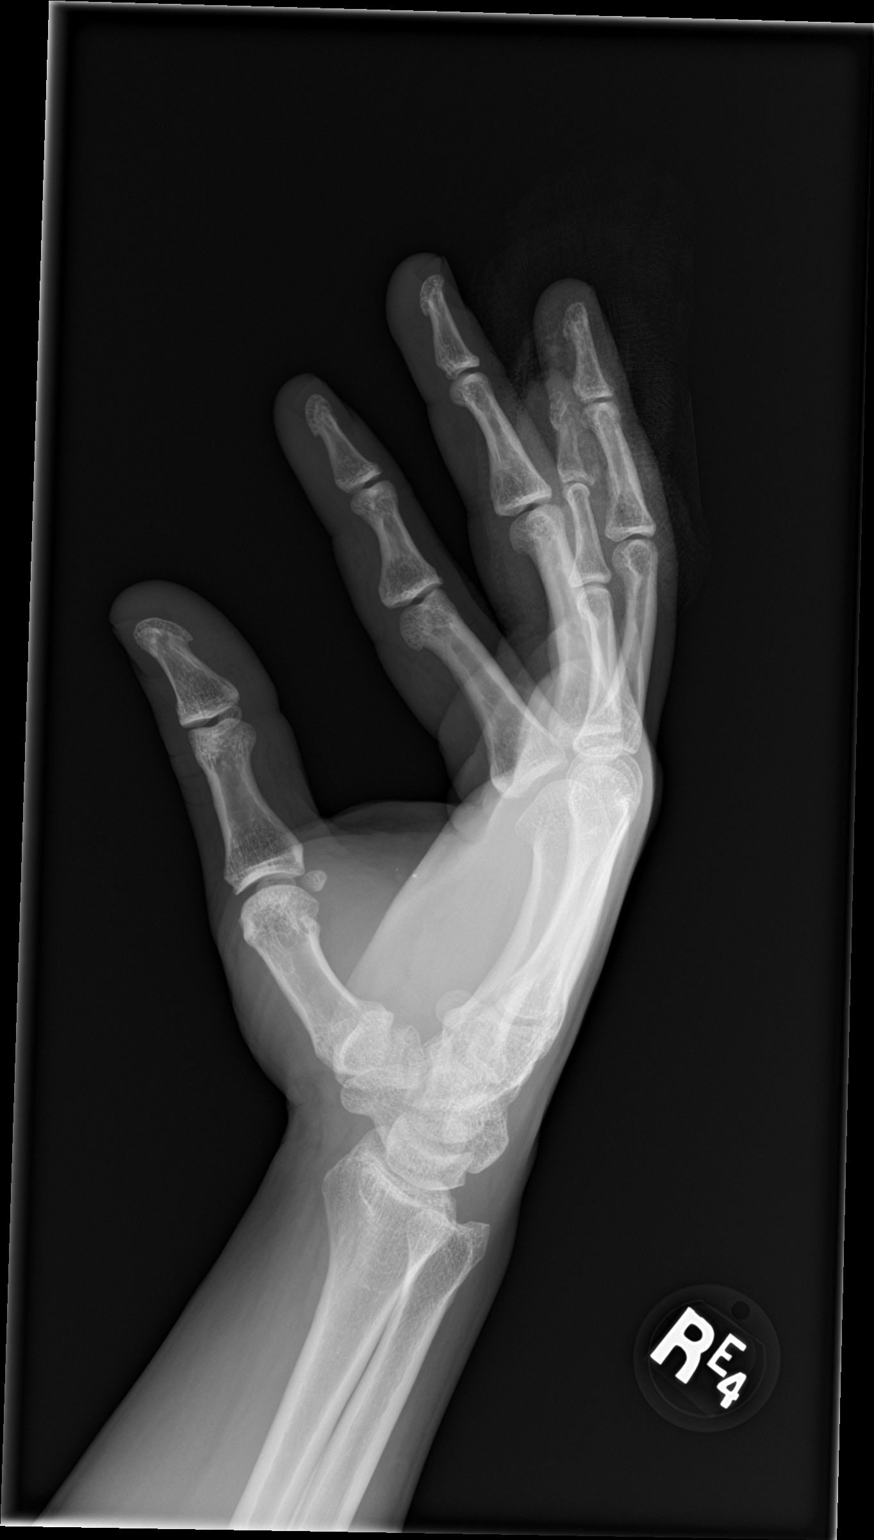

[3 of 3 positions shown; findings below may reference images not displayed]

FINDINGS: Frontal, oblique, lateral views of the right hand are obtained.
There is a large laceration distal aspect fifth digit with open
comminuted fracture of the fifth distal phalanx. No radiopaque
foreign bodies. No other acute bony abnormalities.
IMPRESSION: 1. Open comminuted fracture of the fifth distal phalanx, with large
laceration distal aspect fifth digit.
